# Patient Record
Sex: Female | Born: 1990 | Race: White | Hispanic: No | Marital: Single | Smoking: Never smoker
Health system: Southern US, Community
[De-identification: ages and names within clinical notes are randomized; demographics above are authoritative.]

## PROBLEM LIST (undated history)

## (undated) DIAGNOSIS — D649 Anemia, unspecified: Secondary | ICD-10-CM

## (undated) DIAGNOSIS — J45909 Unspecified asthma, uncomplicated: Secondary | ICD-10-CM

---

## 2017-03-06 ENCOUNTER — Emergency Department (HOSPITAL_COMMUNITY)
Admission: EM | Admit: 2017-03-06 | Discharge: 2017-03-06 | Disposition: A | Discharge status: Home / Self Care | Payer: Worker's Compensation | Attending: Emergency Medicine | Admitting: Emergency Medicine

## 2017-03-06 ENCOUNTER — Encounter (HOSPITAL_COMMUNITY): Payer: Self-pay

## 2017-03-06 ENCOUNTER — Emergency Department (HOSPITAL_COMMUNITY): Payer: Worker's Compensation

## 2017-03-06 DIAGNOSIS — Y990 Civilian activity done for income or pay: Secondary | ICD-10-CM | POA: Diagnosis not present

## 2017-03-06 DIAGNOSIS — J45909 Unspecified asthma, uncomplicated: Secondary | ICD-10-CM | POA: Insufficient documentation

## 2017-03-06 DIAGNOSIS — S8002XA Contusion of left knee, initial encounter: Secondary | ICD-10-CM | POA: Diagnosis not present

## 2017-03-06 DIAGNOSIS — Y9301 Activity, walking, marching and hiking: Secondary | ICD-10-CM | POA: Diagnosis not present

## 2017-03-06 DIAGNOSIS — Y92513 Shop (commercial) as the place of occurrence of the external cause: Secondary | ICD-10-CM | POA: Diagnosis not present

## 2017-03-06 DIAGNOSIS — W0110XA Fall on same level from slipping, tripping and stumbling with subsequent striking against unspecified object, initial encounter: Secondary | ICD-10-CM | POA: Insufficient documentation

## 2017-03-06 DIAGNOSIS — S8992XA Unspecified injury of left lower leg, initial encounter: Secondary | ICD-10-CM | POA: Diagnosis present

## 2017-03-06 HISTORY — DX: Anemia, unspecified: D64.9

## 2017-03-06 HISTORY — DX: Unspecified asthma, uncomplicated: J45.909

## 2017-03-06 MED ORDER — NAPROXEN 500 MG PO TABS: 500.0000 mg | ORAL_TABLET | Freq: Two times a day (BID) | ORAL | 0 refills | Status: AC

## 2017-03-06 MED ORDER — NAPROXEN 250 MG PO TABS
500.0000 mg | ORAL_TABLET | Freq: Once | ORAL | Status: AC
  Administered 2017-03-06: 500 mg via ORAL
  Filled 2017-03-06: qty 2

## 2017-03-06 NOTE — Discharge Instructions (Signed)
It was my pleasure taking care of you today!   Please see the information and instructions below regarding your visit.  Your diagnoses today include:  1. Contusion of left knee, initial encounter    Your x-rays are reassuring today.  There is no fracture or evidence of significant effusion of the knee.  Tests performed today include: See side panel of your discharge paperwork for testing performed today. Vital signs are listed at the bottom of these instructions.   Knee xray  Medications prescribed:    Take any prescribed medications only as prescribed, and any over the counter medications only as directed on the packaging.  Naproxen as needed for pain. Use crutches as needed for comfort. Ice and elevate knee throughout the day.  Home care instructions:  Please follow any educational materials contained in this packet.   Please ice your knee 4-5 times daily.  Please wear the compressive sleeve for 5-7 days.  Follow-up instructions: Please follow-up with your primary care provider as needed for further evaluation of your symptoms if they are not completely improved.   Call the orthopedist listed today or tomorrow to schedule follow up appointment for recheck of ongoing knee pain in one to two weeks. That appointment can be canceled with a 24-48 hour notice if complete resolution of pain.   Follow up with the orthopedist listed if symptoms do not improve in one week.   Return instructions:  Please return to the Emergency Department if you experience worsening symptoms.  Please return for any increasing swelling, increasing pain, loss of color to the lower leg, or increasing redness. Please return if you have any other emergent concerns.  Additional Information:   Your vital signs today were: BP (!) 147/117 (BP Location: Right Arm)    Pulse 91    Temp 98.1 F (36.7 C) (Oral)    Resp 18    Ht 5\' 8"  (1.727 m)    Wt 121.6 kg (268 lb)    LMP 03/06/2017 (Exact Date)    SpO2 99%     BMI 40.75 kg/m  If your blood pressure (BP) was elevated on multiple readings during this visit above 130 for the top number or above 80 for the bottom number, please have this repeated by your primary care provider within one month. --------------

## 2017-03-06 NOTE — ED Notes (Signed)
Patient transported to X-ray 

## 2017-03-06 NOTE — ED Provider Notes (Signed)
[facility]   CSN: [id number] Arrival date & time: 03/06/17  1714     History   Chief Complaint No chief complaint on file.   HPI Tracy EinsteinKayleigh Woodward is a 26 y.o. female.  HPI   Patient is a 25-year female with a history of anemia and asthma presenting for acute injury to the left knee.  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  Patient reports that she came down full force on her left knee.  Patient is now reporting some swelling to the superior aspect of the left knee, as well as some minor paresthesias down to the distal left lower extremity.  No syncopal or presyncopal episode preceded this event.  Past Medical History:  Diagnosis Date  . Anemia   . Asthma     There are no active problems to display for this patient.   History reviewed. No pertinent surgical history.  OB History    No data available       Home Medications    Prior to Admission medications   Medication Sig Start Date End Date Taking? Authorizing Provider  naproxen (NAPROSYN) 500 MG tablet Take 1 tablet (500 mg total) 2 (two) times daily by mouth. 03/06/17   Elisha PonderMurray, Alyssa B, PA-C    Family History No family history on file.  Social History Social History   Tobacco Use  . Smoking status: Never Smoker  . Smokeless tobacco: Never Used  Substance Use Topics  . Alcohol use: Not on file  . Drug use: Not on file     Allergies   Patient has no known allergies.   Review of Systems Review of Systems  Musculoskeletal: Positive for arthralgias and joint swelling.  Neurological: Negative for syncope, weakness, light-headedness and numbness.     Physical Exam Updated Vital Signs BP (!) 147/117 (BP Location: Right Arm)   Pulse 91   Temp 98.1 F (36.7 C) (Oral)   Resp 18   Ht 5\' 8"  (1.727 m)   Wt 121.6 kg (268 lb)   LMP  03/06/2017 (Exact Date)   SpO2 99%   BMI 40.75 kg/m   Physical Exam  Constitutional: She appears well-developed and well-nourished. No distress.  Sitting comfortably in bed.  HENT:  Head: Normocephalic and atraumatic.  Eyes: Conjunctivae are normal. Right eye exhibits no discharge. Left eye exhibits no discharge.  EOMs normal to gross examination.  Neck: Normal range of motion.  Cardiovascular: Normal rate and regular rhythm.  Intact, 2+ DP and PT pulses.  Pulmonary/Chest:  Normal respiratory effort. Patient converses comfortably. No audible wheeze or stridor.  Abdominal: She exhibits no distension.  Musculoskeletal: Normal range of motion.  Left knee with tenderness to palpation of patella and superior aspect of knee. Full ROM. No joint line tenderness. No joint effusion but minor swelling appreciated of her patella. No abnormal alignment or patellar mobility. No bruising, erythema or warmth overlaying the joint. No varus/valgus laxity. Negative drawer's, Lachman's and McMurray's.  No crepitus.  2+ DP pulses bilaterally. All compartments are soft. Sensation intact distal to injury.   Neurological: She is alert.  Cranial nerves intact to gross observation. Patient moves extremities without difficulty.  Skin: Skin is warm and dry. She is not diaphoretic.  Psychiatric: She has a normal mood and affect. Her behavior is normal. Judgment and thought content normal.  Nursing note and  vitals reviewed.    ED Treatments / Results  Labs (all labs ordered are listed, but only abnormal results are displayed) Labs Reviewed - No data to display  EKG  EKG Interpretation None       Radiology Dg Knee Complete 4 Views Left  Result Date: 03/06/2017 CLINICAL DATA:  Patient complains of left knee pain after tripping over construction material at work and falling onto left knee. pain with rest and ROM EXAM: LEFT KNEE - COMPLETE 4+ VIEW COMPARISON:  None. FINDINGS: No fracture of the proximal  tibia or distal femur. Patella is normal. No joint effusion. Healed benign cortical defect in the femoral metaphysis. IMPRESSION: No fracture or dislocation. Electronically Signed   By: Genevive BiStewart  Edmunds M.D.   On: 03/06/2017 18:18    Procedures Procedures (including critical care time)  Medications Ordered in ED Medications  naproxen (NAPROSYN) tablet 500 mg (not administered)     Initial Impression / Assessment and Plan / ED Course  I have reviewed the triage vital signs and the nursing notes.  Pertinent labs & imaging results that were available during my care of the patient were reviewed by me and considered in my medical decision making (see chart for details).     Final Clinical Impressions(s) / ED Diagnoses   Final diagnoses:  Contusion of left knee, initial encounter   Patient is well-appearing and ambulatory.  Patient presents status post a fall onto the left patella.  There is some minor swelling around the patella, however there is no crepitus or step-off appreciated.  X-ray is negative for patellar injury.  There appears to be a well-healed cortical defect in the femoral metaphysis, and this is not concerning.  The patellar tendon appears intact.  Patient exhibits no laxity of ligaments of the knee.  ED Discharge Orders        Ordered    naproxen (NAPROSYN) 500 MG tablet  2 times daily     03/06/17 1901       Delia ChimesMurray, Alyssa B, PA-C 03/06/17 1911    Maia PlanLong, Matie Dimaano G, MD 03/06/17 2118

## 2017-03-06 NOTE — ED Triage Notes (Signed)
Patient complains of left knee pain after tripping over construction material at work and falling onto left knee. pain with rest and ROM

## 2018-05-09 IMAGING — CR DG KNEE COMPLETE 4+V*L*
4 series · 4 of 4 positions shown · non-contrast
Comparison: None.

CLINICAL DATA: Patient complains of left knee pain after tripping
over construction material at work and falling onto left knee. pain
with rest and ROM

EXAM:
LEFT KNEE - COMPLETE 4+ VIEW

[knee ap]
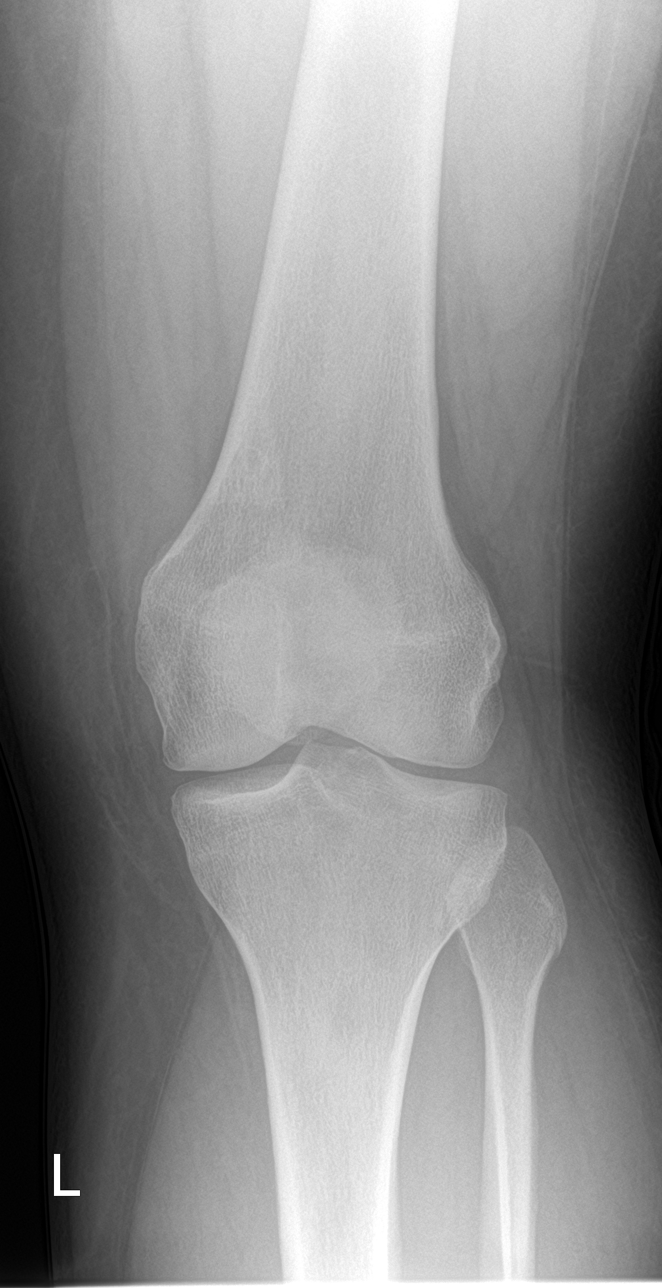

[knee obl (1 of 2)]
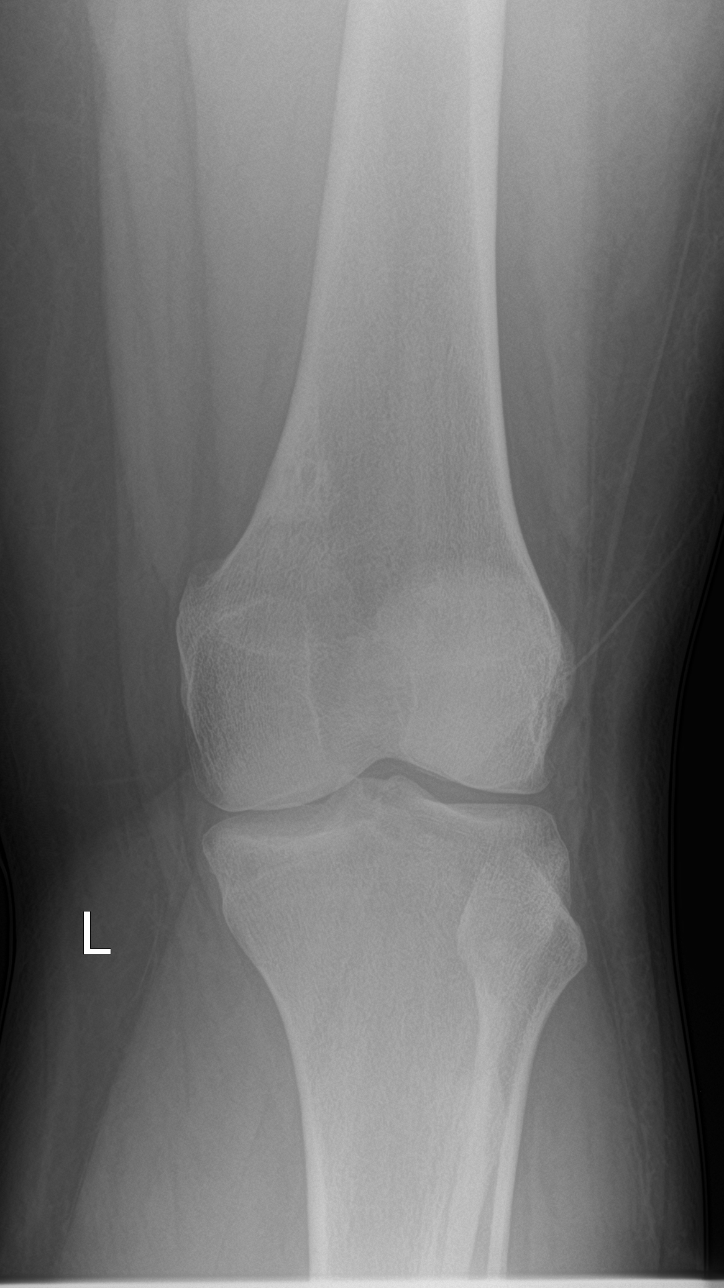

[knee obl (2 of 2)]
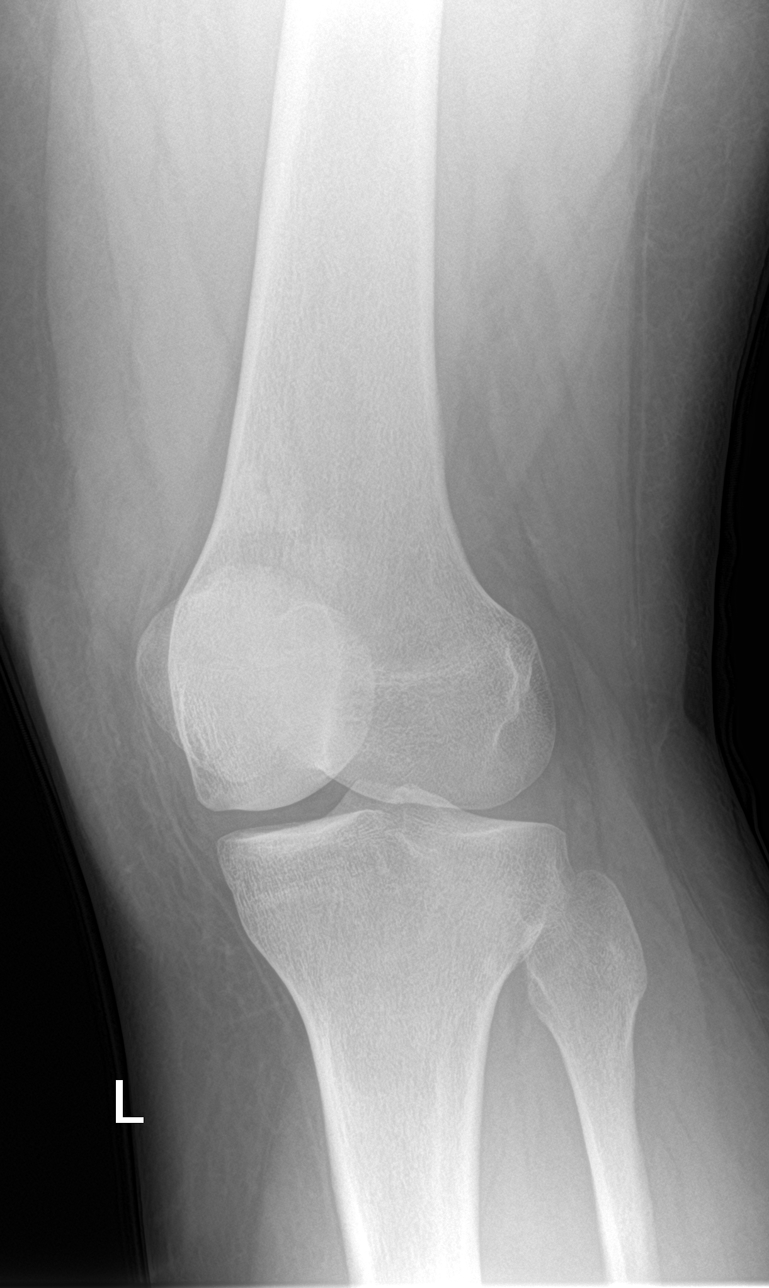

[knee lat]
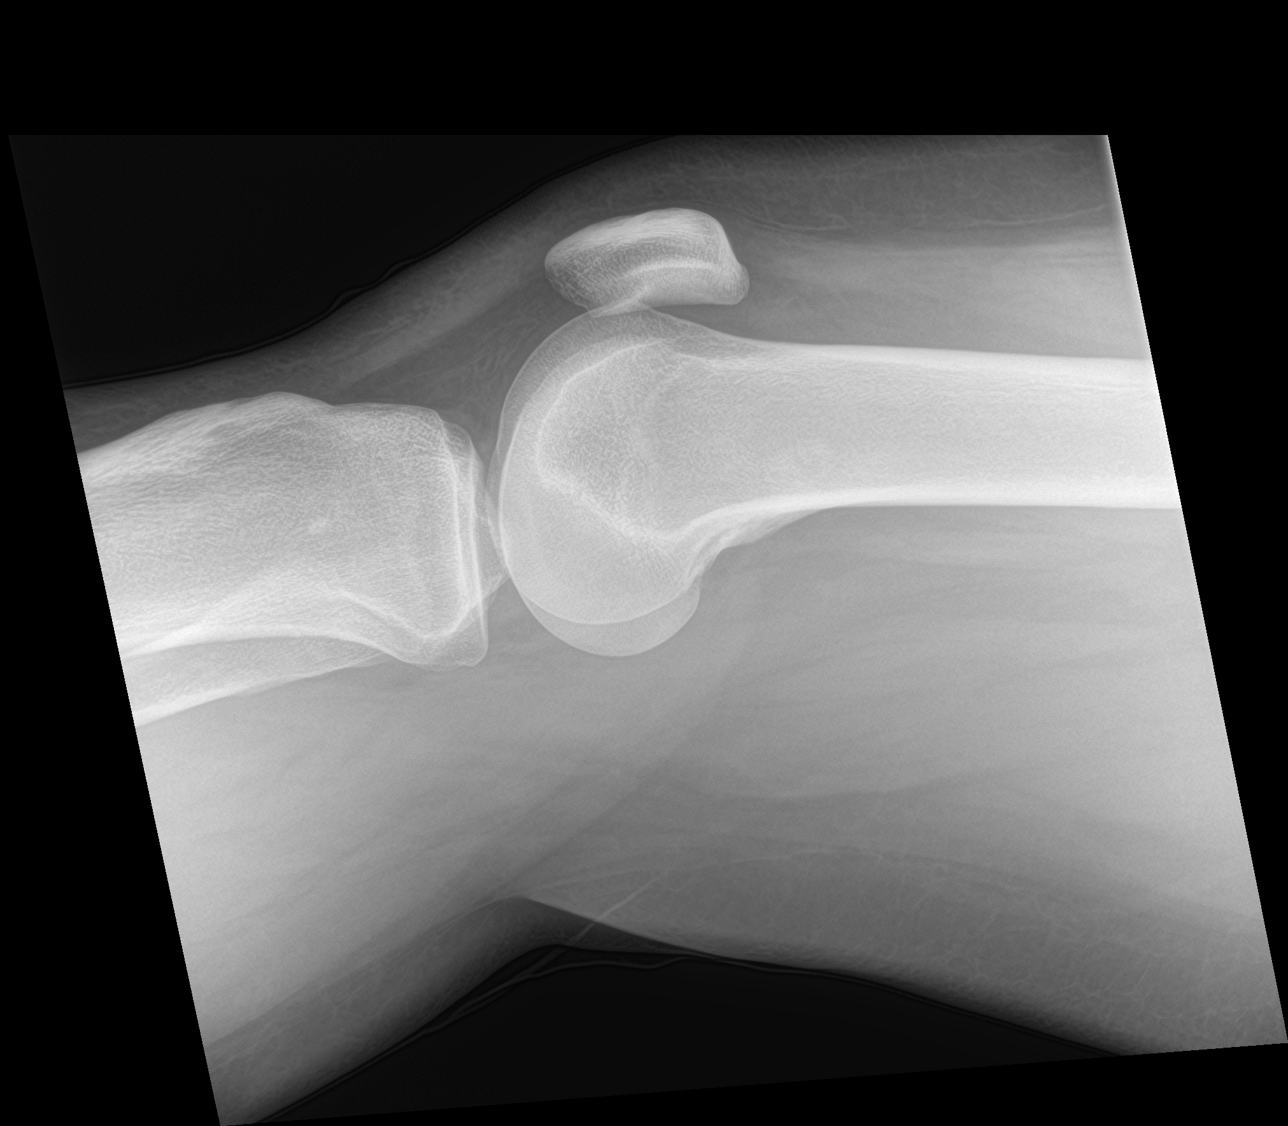

[4 of 4 positions shown; findings below may reference images not displayed]

FINDINGS: No fracture of the proximal tibia or distal femur. Patella is
normal. No joint effusion. Healed benign cortical defect in the
femoral metaphysis.
IMPRESSION: No fracture or dislocation.

## 2018-07-17 ENCOUNTER — Emergency Department (HOSPITAL_COMMUNITY): Payer: BLUE CROSS/BLUE SHIELD

## 2018-07-17 ENCOUNTER — Other Ambulatory Visit: Payer: Self-pay

## 2018-07-17 ENCOUNTER — Encounter (HOSPITAL_COMMUNITY): Payer: Self-pay | Admitting: Emergency Medicine

## 2018-07-17 ENCOUNTER — Emergency Department (HOSPITAL_COMMUNITY)
Admission: EM | Admit: 2018-07-17 | Discharge: 2018-07-17 | Disposition: A | Discharge status: Home / Self Care | Payer: BLUE CROSS/BLUE SHIELD | Attending: Emergency Medicine | Admitting: Emergency Medicine

## 2018-07-17 DIAGNOSIS — J40 Bronchitis, not specified as acute or chronic: Secondary | ICD-10-CM | POA: Insufficient documentation

## 2018-07-17 DIAGNOSIS — R509 Fever, unspecified: Secondary | ICD-10-CM | POA: Diagnosis not present

## 2018-07-17 DIAGNOSIS — R05 Cough: Secondary | ICD-10-CM | POA: Diagnosis present

## 2018-07-17 DIAGNOSIS — R0789 Other chest pain: Secondary | ICD-10-CM | POA: Insufficient documentation

## 2018-07-17 DIAGNOSIS — J4521 Mild intermittent asthma with (acute) exacerbation: Secondary | ICD-10-CM | POA: Diagnosis not present

## 2018-07-17 MED ORDER — PREDNISONE 20 MG PO TABS
60.0000 mg | ORAL_TABLET | Freq: Once | ORAL | Status: AC
  Administered 2018-07-17: 60 mg via ORAL
  Filled 2018-07-17: qty 3

## 2018-07-17 MED ORDER — IPRATROPIUM BROMIDE HFA 17 MCG/ACT IN AERS
2.0000 | INHALATION_SPRAY | Freq: Once | RESPIRATORY_TRACT | Status: AC
  Administered 2018-07-17: 2 via RESPIRATORY_TRACT
  Filled 2018-07-17: qty 12.9

## 2018-07-17 MED ORDER — AZITHROMYCIN 250 MG PO TABS: 250.0000 mg | ORAL_TABLET | Freq: Every day | ORAL | 0 refills | Status: AC

## 2018-07-17 MED ORDER — AZITHROMYCIN 250 MG PO TABS
500.0000 mg | ORAL_TABLET | Freq: Once | ORAL | Status: AC
  Administered 2018-07-17: 500 mg via ORAL
  Filled 2018-07-17: qty 2

## 2018-07-17 MED ORDER — PREDNISONE 10 MG (21) PO TBPK: ORAL_TABLET | Freq: Every day | ORAL | 0 refills | Status: AC

## 2018-07-17 NOTE — Discharge Instructions (Addendum)
You were seen in the ER for persistent cough, chest tightness and fever.  You did not have a fever in the ER.  Your chest x-ray was normal.  It is unclear if there was a true, confirmed coronavirus case in your workplace.  Currently, you do not meet criteria for formal testing.  I have low suspicion that this is coronavirus.  Instead, I think that this is an asthma flare possibly early pneumonia.  We will treat your symptoms with Atrovent, Advair inhalers, prednisone and azithromycin.  We are recommending staying at home until three full days after your symptoms have completely resolved, before you return to work.  You may call your primary care doctor before you return to work to ensure no other concerning symptoms have developed  Return to the ER if there is any new or worsening symptoms, fever, heavier cough, chest pain or shortness of breath.

## 2018-07-17 NOTE — ED Provider Notes (Signed)
[facility]-EMERGENCY DEPT Provider Note   CSN: [id number] Arrival date & time: 07/17/18  1639    History   Chief Complaint Chief Complaint  Patient presents with  . Cough  . Fever    HPI Tracy Woodward is a 28 y.o. female with history of asthma is here for evaluation of cough.  Onset 1 week ago.  Described as dry but over the last day intermittently productive of green/yellow sputum.  Associated with "chest tightness", diffuse, constant that is also been getting worse over the last 24 hours.  She had a phone appointment with her PCP on Tuesday and was diagnosed with asthmatic bronchitis.  She was prescribed Advair which she has been using every 12 hours.  She was at work and her supervisor recommended checking her temperature, she had 100.4 fever through the forehead.  She received albuterol treatment with fire department prior to EMS arrival on scene.  Patient states that her supervisor did not disclose any details about possible coronavirus in her workplace.  Some coworkers were saying there may have been 1 person that had been tested but she does not know the results or any other details about this.  This person works in a different office.  Patient has been walking through this office but denies any immediate/contact with this person.  Pt feels like this is an asthma flare and does not think she needs testing. No cigarette use.  Reports people at work have had influenza.  She has required 1 admission to hospital for asthma flare but no intubations.  She denies any ground/air travel in the last 2 months.  She denies any chills, sweats, chest pain or shortness of breath, nausea, vomiting, abdominal pain, diarrhea, myalgias.   HPI  Past Medical History:  Diagnosis Date  . Anemia   . Asthma     There are no active problems to display for this patient.   History reviewed. No pertinent surgical history.   OB History   No obstetric history on file.       Home Medications    Prior to Admission medications   Medication Sig Start Date End Date Taking? Authorizing Provider  azithromycin (ZITHROMAX) 250 MG tablet Take 1 tablet (250 mg total) by mouth daily for 4 days. Take first 2 tablets together, then 1 every day until finished. 07/17/18 07/21/18  Liberty Handy, PA-C  naproxen (NAPROSYN) 500 MG tablet Take 1 tablet (500 mg total) 2 (two) times daily by mouth. 03/06/17   Aviva Kluver B, PA-C  predniSONE (STERAPRED UNI-PAK 21 TAB) 10 MG (21) TBPK tablet Take by mouth daily. Take 6 tabs by mouth daily  for 2 days, then 5 tabs for 2 days, then 4 tabs for 2 days, then 3 tabs for 2 days, 2 tabs for 2 days, then 1 tab by mouth daily for 2 days 07/17/18   Liberty Handy, PA-C    Family History No family history on file.  Social History Social History   Tobacco Use  . Smoking status: Never Smoker  . Smokeless tobacco: Never Used  Substance Use Topics  . Alcohol use: Not on file  . Drug use: Not on file     Allergies   Patient has no known allergies.   Review of Systems Review of Systems  Constitutional: Positive for fever (reported).  Respiratory: Positive for cough and chest tightness.   All other systems reviewed and are negative.    Physical Exam Updated Vital Signs BP Marland Kitchen)  137/94 (BP Location: Left Arm)   Pulse (!) 101   Temp 98.5 F (36.9 C) (Oral)   Resp 16   Ht  (1.727 m)   Wt 122.5 kg   LMP 07/16/2018   SpO2 96%   BMI 41.05 kg/m   Physical Exam Vitals signs and nursing note reviewed.  Constitutional:      General: She is not in acute distress.    Appearance: She is well-developed.     Comments: NAD.  HENT:     Head: Normocephalic and atraumatic.     Right Ear: External ear normal.     Left Ear: External ear normal.     Nose: Nose normal.     Mouth/Throat:     Mouth: Mucous membranes are moist.     Comments: Oropharynx and tonsils normal. Eyes:     General: No scleral icterus.     Conjunctiva/sclera: Conjunctivae normal.  Neck:     Musculoskeletal: Normal range of motion and neck supple.  Cardiovascular:     Rate and Rhythm: Normal rate and regular rhythm.     Heart sounds: Normal heart sounds.  Pulmonary:     Effort: Pulmonary effort is normal.     Breath sounds: Wheezing present.     Comments: Faint end expiratory wheezing to mid/lower lobes.  Normal work of breathing.  No crackles. Musculoskeletal: Normal range of motion.        General: No deformity.  Skin:    General: Skin is warm and dry.     Capillary Refill: Capillary refill takes less than 2 seconds.  Neurological:     Mental Status: She is alert and oriented to person, place, and time.  Psychiatric:        Behavior: Behavior normal.        Thought Content: Thought content normal.        Judgment: Judgment normal.      ED Treatments / Results  Labs (all labs ordered are listed, but only abnormal results are displayed) Labs Reviewed - No data to display  EKG None  Radiology Dg Chest [facility] 1 View  Result Date: 07/17/2018 CLINICAL DATA:  Fever, nonproductive cough, and chest pain. EXAM: PORTABLE CHEST 1 VIEW COMPARISON:  None. FINDINGS: The heart size and mediastinal contours are within normal limits. Both lungs are clear. The visualized skeletal structures are unremarkable. IMPRESSION: Normal study. Electronically Signed   By: Myles Rosenthal M.D.   On: 07/17/2018 18:22    Procedures Procedures (including critical care time)  Medications Ordered in ED Medications  predniSONE (DELTASONE) tablet 60 mg (60 mg Oral Given 07/17/18 1852)  ipratropium (ATROVENT HFA) inhaler 2 puff (2 puffs Inhalation Given 07/17/18 1854)  azithromycin (ZITHROMAX) tablet 500 mg (500 mg Oral Given 07/17/18 1852)     Initial Impression / Assessment and Plan / ED Course  I have reviewed the triage vital signs and the nursing notes.  Pertinent labs & imaging results that were available during my care of the patient were  reviewed by me and considered in my medical decision making (see chart for details).       Symptoms and exam most consistent with asthma exacerbation.  Likely began from a virus but given duration of cough, worsening chest tightness and reported fever at workplace concern for early pneumonia.  She is afebrile without tachycardia, tachypnea, hypoxia or increased work of breathing.  Only faint expiratory wheezing on exam but overall well-appearing.  Chest x-ray is negative.  She is only  been using Advair, will discharge with Atrovent inhaler, prednisone for asthma exacerbation.  We will also give a Z-Pak given duration of symptoms and reported fever.  Patient has no known immediate exposure to a confirmed coronavirus patient or travel to high risk areas.  It is very unclear/vague if there was a potential tested coworker in her workplace.  I do not think she meets criteria for formal testing.  Will recommend monitoring her symptoms at home and return to work after 3 full days of being asymptomatic.  Return precautions were given.  Patient is comfortable with this plan.  Final Clinical Impressions(s) / ED Diagnoses   Final diagnoses:  Mild intermittent asthma with exacerbation  Bronchitis    ED Discharge Orders         Ordered    predniSONE (STERAPRED UNI-PAK 21 TAB) 10 MG (21) TBPK tablet  Daily     07/17/18 1839    azithromycin (ZITHROMAX) 250 MG tablet  Daily     07/17/18 1839           Jerrell Mylar 07/17/18 Herbie Baltimore    Mancel Bale, MD 07/20/18 [phone]

## 2018-07-17 NOTE — ED Triage Notes (Addendum)
Per EMS pt recently diagnosed with bronchitis by PCP. Request ED evaluation for continued sob, cough for a week; new onset fever within past day. Has coworkers that are suspected COVID.   Given an albuterol treatment with fire department prior to EMS arrival on scene.
